# Patient Record
Sex: Male | Born: 1995 | Race: White | Hispanic: No | Marital: Single
Health system: Southern US, Community
[De-identification: ages and names within clinical notes are randomized; demographics above are authoritative.]

## PROBLEM LIST (undated history)

## (undated) DIAGNOSIS — S43014A Anterior dislocation of right humerus, initial encounter: Secondary | ICD-10-CM

## (undated) DIAGNOSIS — S43439A Superior glenoid labrum lesion of unspecified shoulder, initial encounter: Secondary | ICD-10-CM

## (undated) DIAGNOSIS — R198 Other specified symptoms and signs involving the digestive system and abdomen: Secondary | ICD-10-CM

## (undated) DIAGNOSIS — R1310 Dysphagia, unspecified: Secondary | ICD-10-CM

---
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
## 2017-08-26 IMAGING — CR DG SHOULDER 2+V*R*
3 series · 3 of 3 positions shown · non-contrast
Comparison: None.

CLINICAL DATA: Patient started football practice a few weeks ago;
c/o anterior and superior shoulder discomfort and popping; raising
arm above head is difficult; no specific injury, just contact from
football practice

EXAM:
RIGHT SHOULDER - 2+ VIEW

[w shoulder ap external righ]
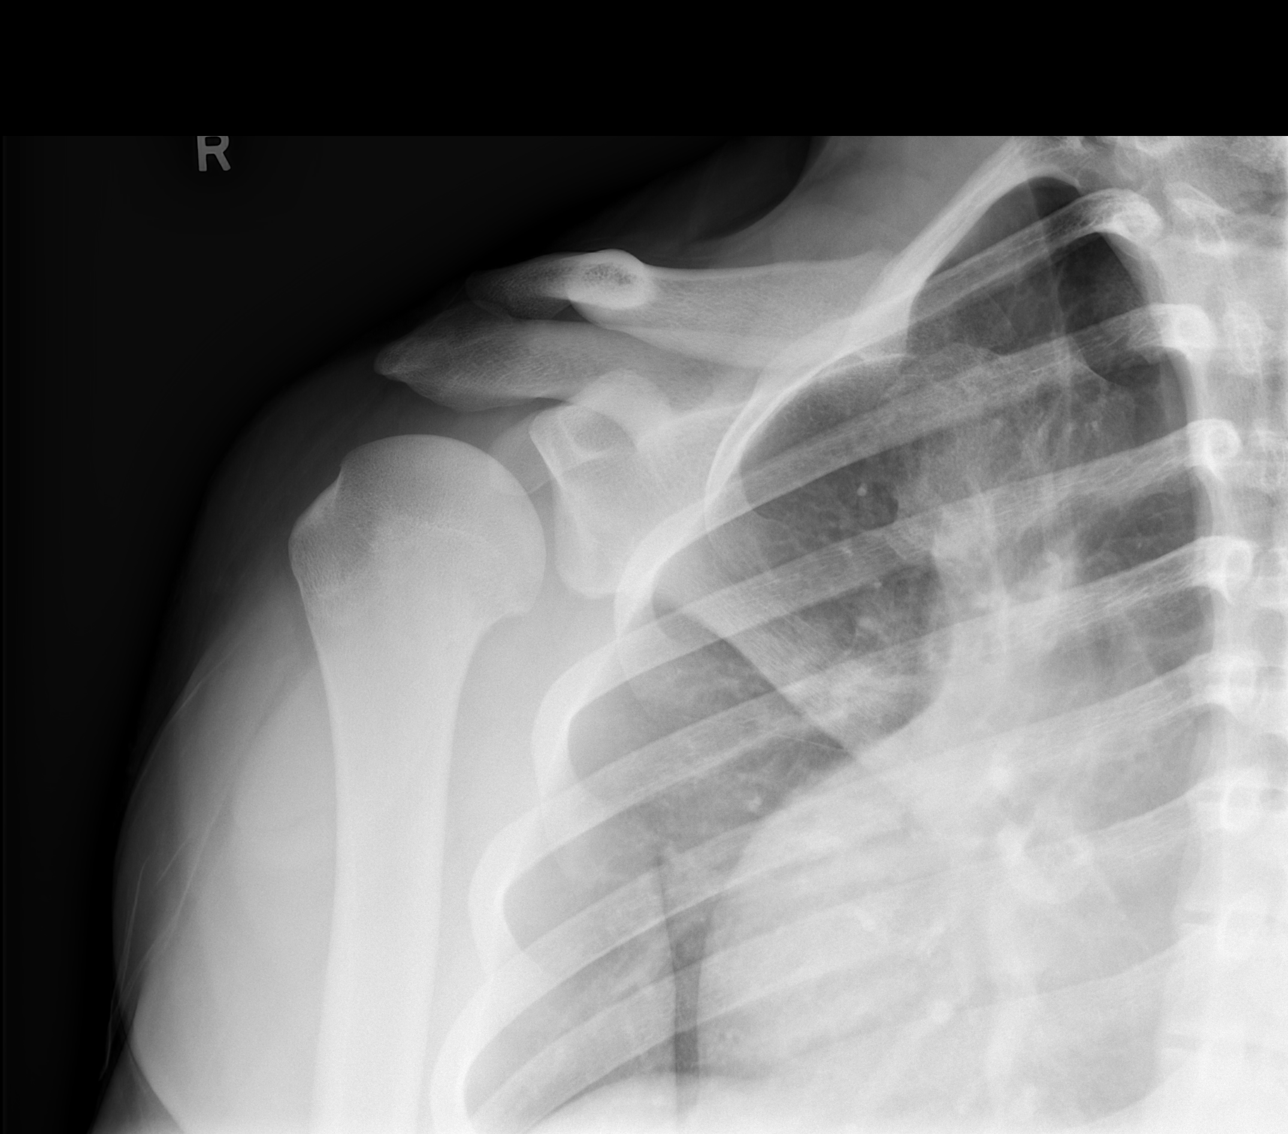

[w shoulder y view right]
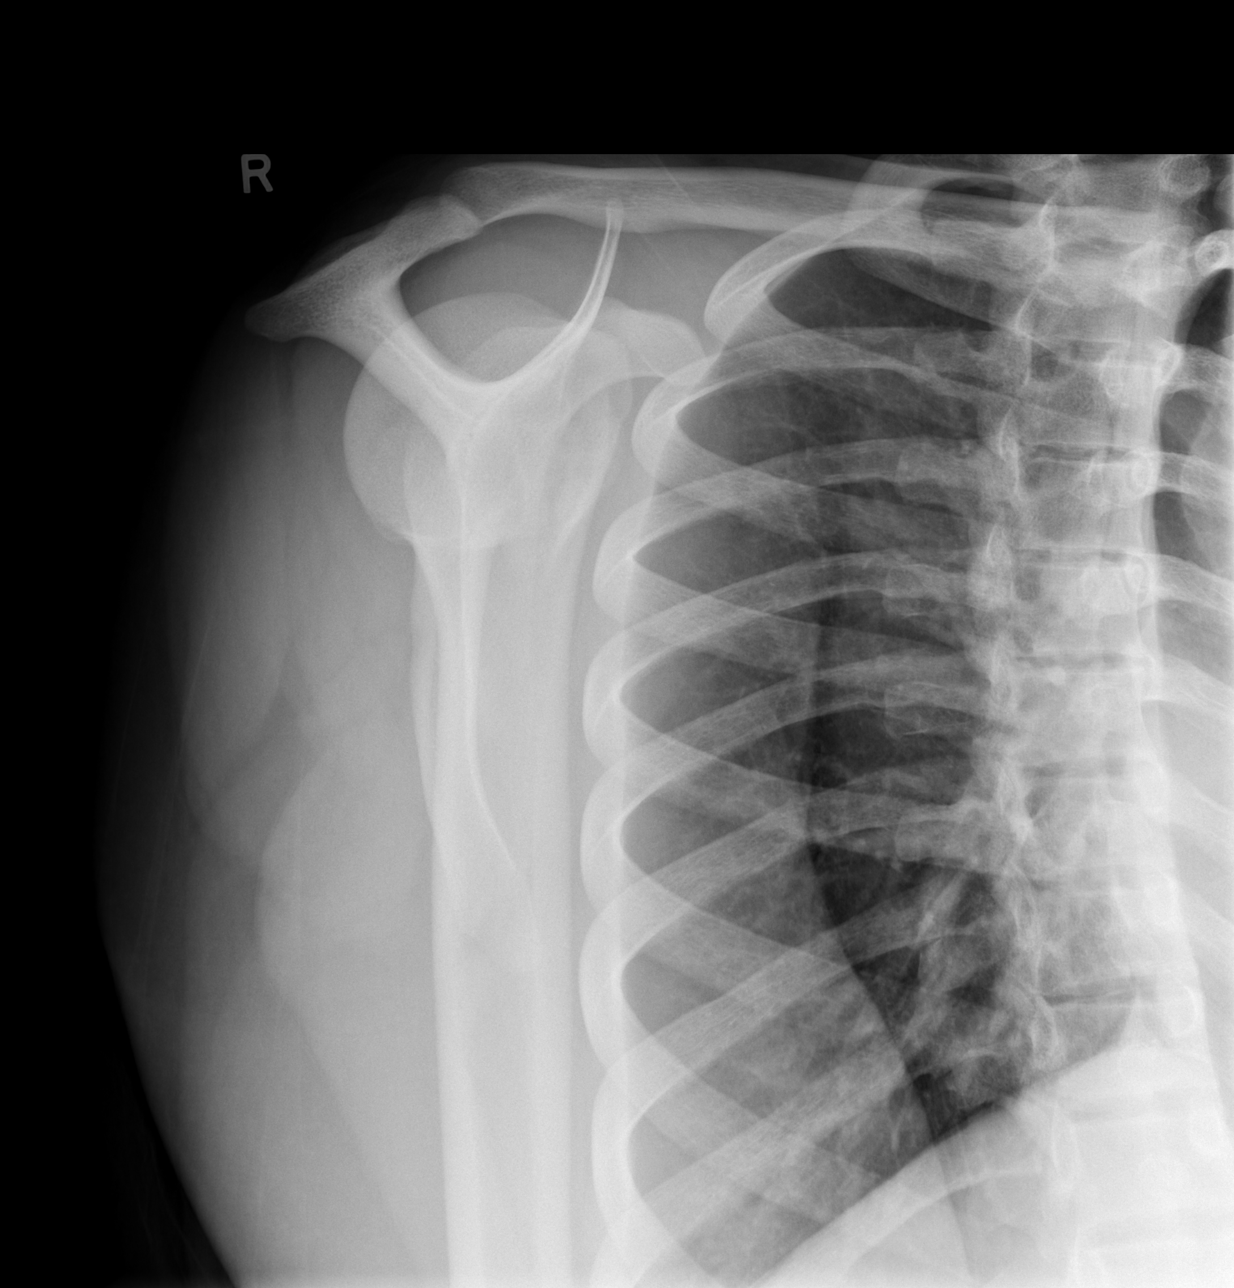

[x shoulder axillary right]
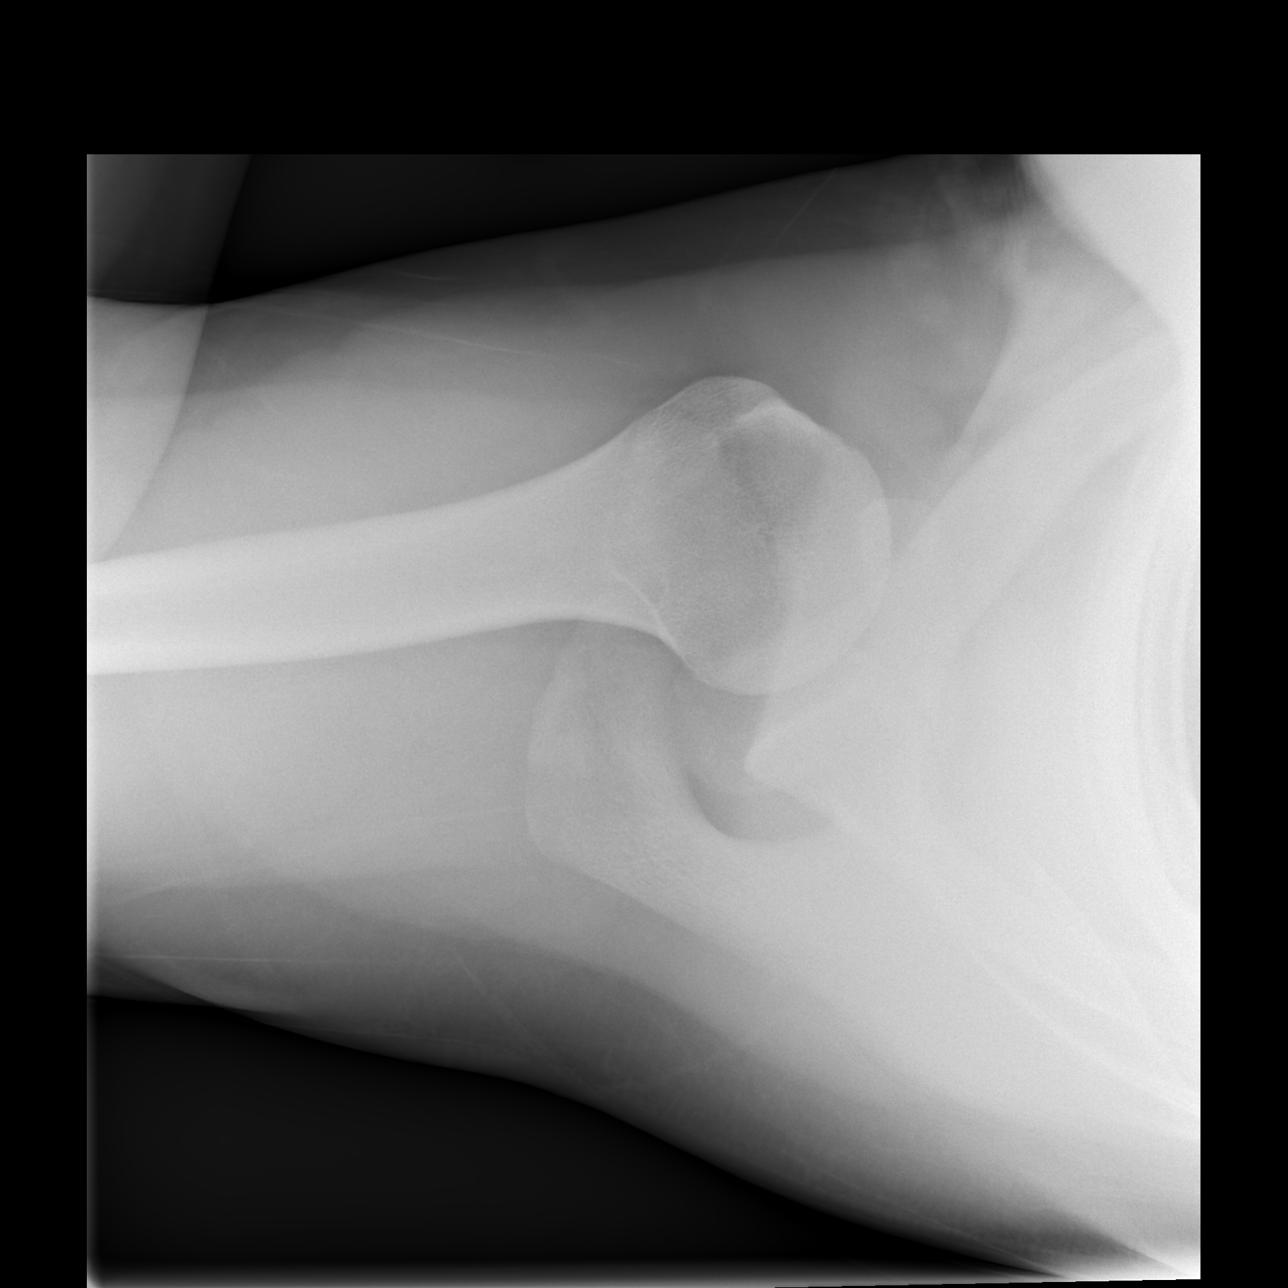

[3 of 3 positions shown; findings below may reference images not displayed]

FINDINGS: There is no evidence of fracture or dislocation. There is no
evidence of arthropathy or other focal bone abnormality. Soft
tissues are unremarkable.
IMPRESSION: Negative.

## 2017-09-11 IMAGING — MR MR SHOULDER*R* W/CM
4 of 5 series · 19 of 40 positions shown · IV contrast (agent unspecified)
Comparison: None.

CLINICAL DATA: Right shoulder pain for 3 years.

EXAM:
MR ARTHROGRAM OF THE  SHOULDER
TECHNIQUE: Multiplanar, multisequence MR imaging of the shoulder was performed
following the administration of intra-articular contrast.
CONTRAST:  See Injection Documentation.

[Series 3: T1 fat-sat · axial · 4.0mm · 0.22mm/px · z∈[-15,+64]mm · 5 of 23 slices shown (1 of 2)]
[im 1/23]
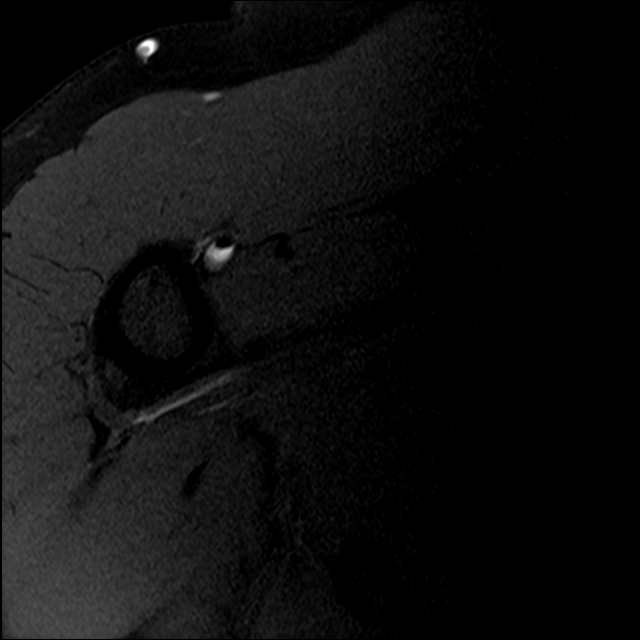
[im 4/23]
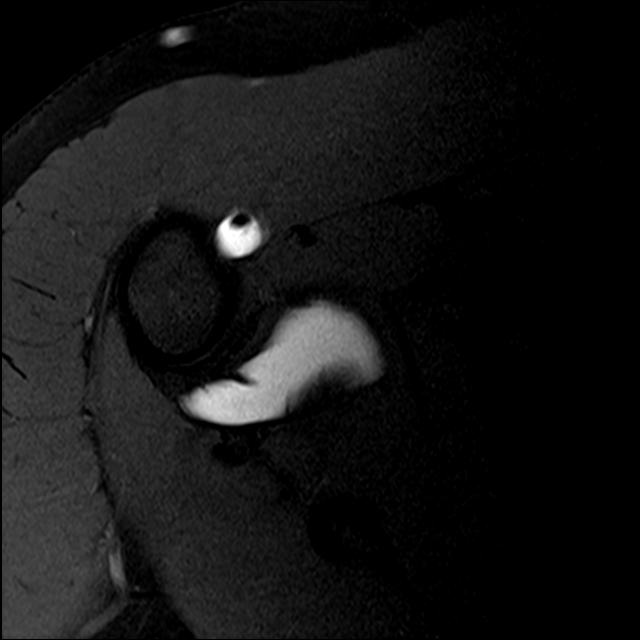
[im 7/23]
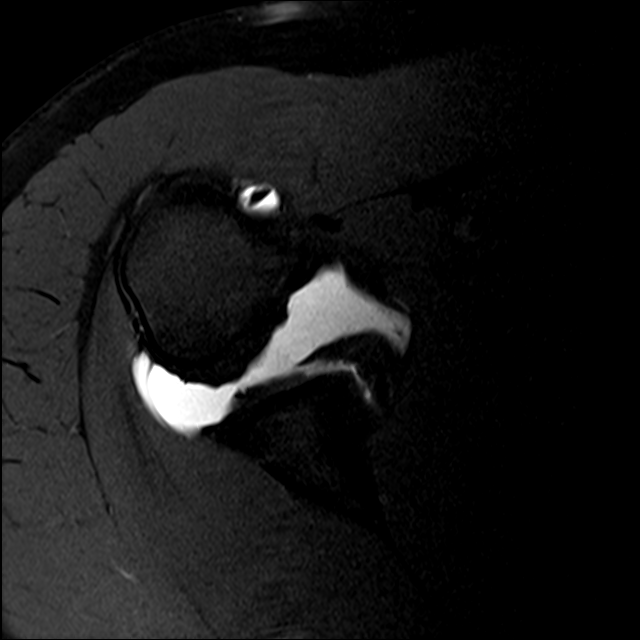
[im 13/23]
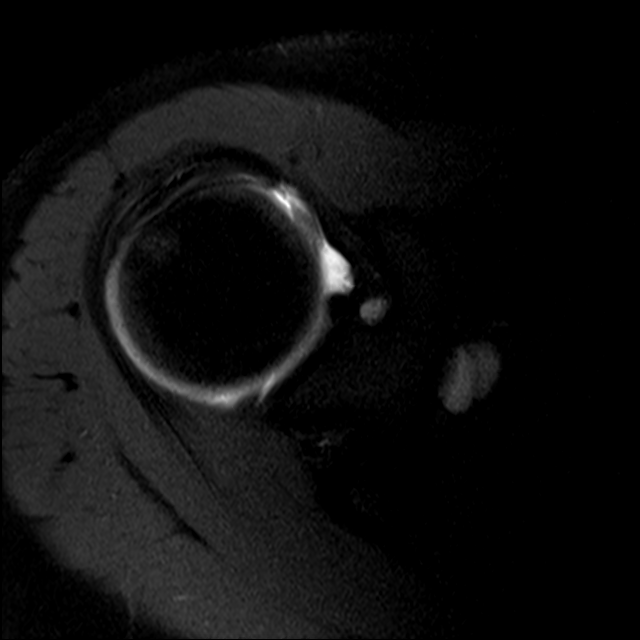
[im 19/23]
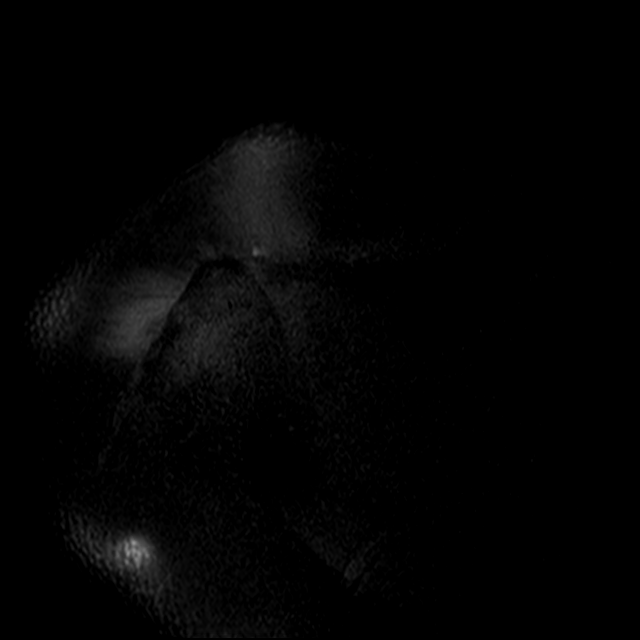

[Series 4: T2 fat-sat · coronal · 4.0mm · 0.44mm/px · 8 of 20 slices shown]
[im 1/20]
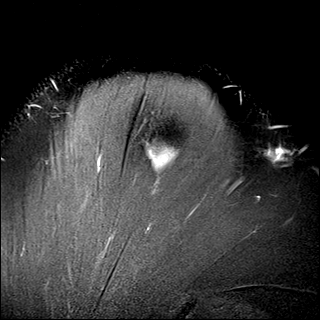
[im 3/20]
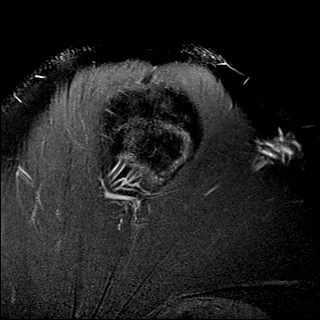
[im 6/20]
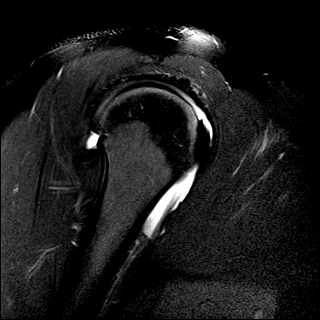
[im 9/20]
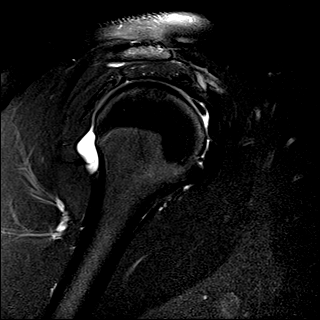
[im 11/20]
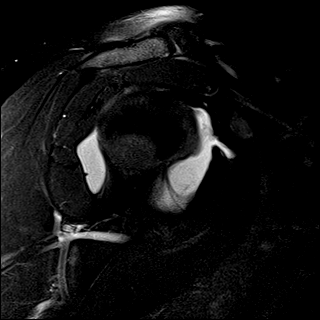
[im 14/20]
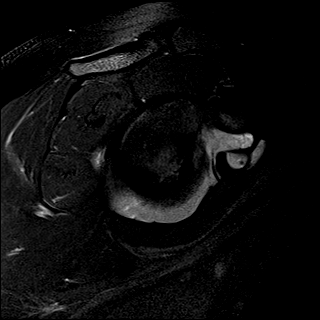
[im 17/20]
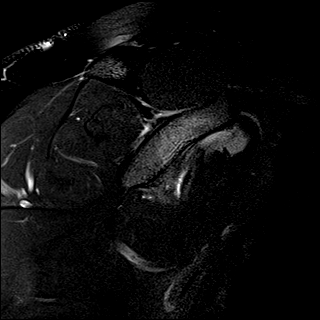
[im 20/20]
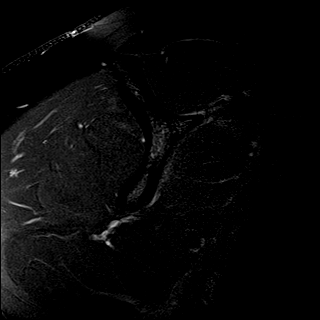

[Series 5: T1 fat-sat · oblique · 4.0mm · 0.22mm/px · 3 of 20 slices shown (2 of 2)]
[im 3/20]
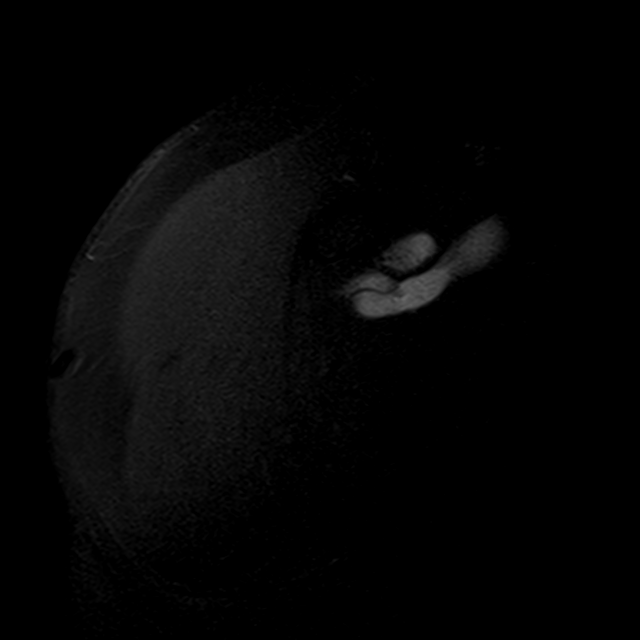
[im 11/20]
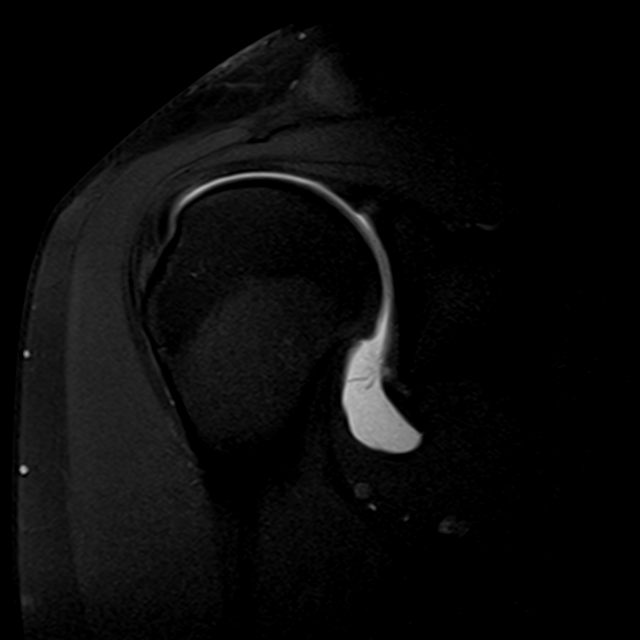
[im 17/20]
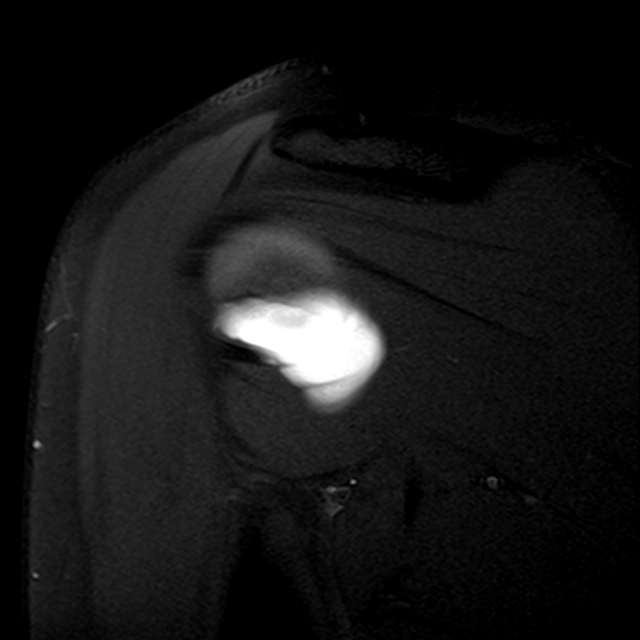

[Series 6: T1 · oblique · 4.0mm · 0.22mm/px · 3 of 20 slices shown]
[im 3/20]
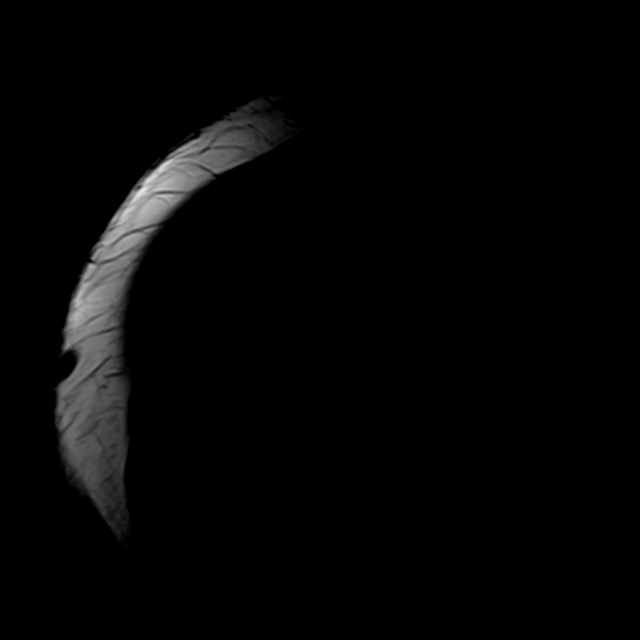
[im 11/20]
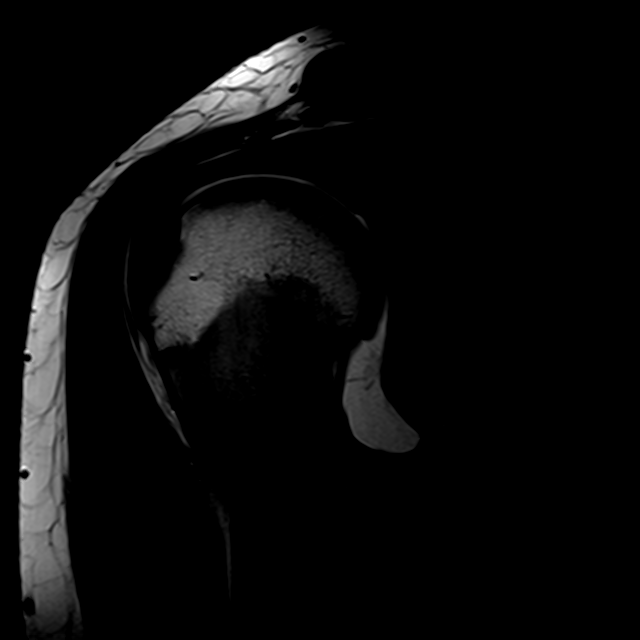
[im 17/20]
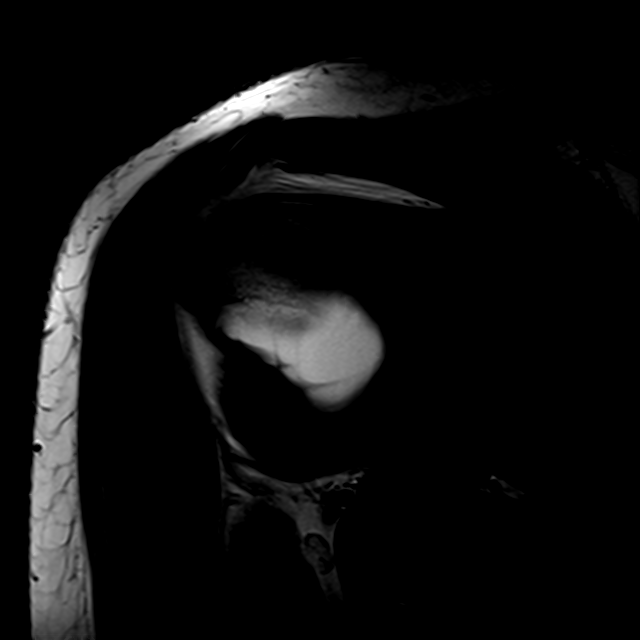

[19 of 40 positions shown; findings below may reference images not displayed]

FINDINGS: Rotator cuff: Mild tendinosis of the supraspinatus and infraspinatus
tendons. Teres minor tendon is intact. Subscapularis tendon is
intact.

Muscles: No atrophy or fatty replacement of nor abnormal signal
within, the muscles of the rotator cuff.

Biceps long head: Intact.

Acromioclavicular Joint: Normal acromioclavicular joint. Type IV
acromion. No subacromial/subdeltoid bursal fluid.

Glenohumeral Joint: Intra-articular contrast distending the joint
capsule. No chondral defect. Normal glenohumeral ligaments.

Labrum: Anterior-inferior labral tear with underlying glenoid
fracture involving 25% of the circumference.

Bones: Mild marrow edema of the superior lateral humeral head. No
other marrow signal abnormality. No acute fracture or dislocation.
IMPRESSION: 1. Anterior-inferior labral tear with underlying glenoid fracture
involving 25% of the circumference.

2. Mild tendinosis of the supraspinatus and infraspinatus tendons.

## 2017-09-11 IMAGING — XA DG FLUORO GUIDE NDL PLC/BX
1 series · 1 of 1 positions shown · non-contrast
Comparison: none

CLINICAL DATA: Football player with shoulder injury and pain.

[Series 1: ortho standard · 1 of 1 slices shown]
[im 1/1]
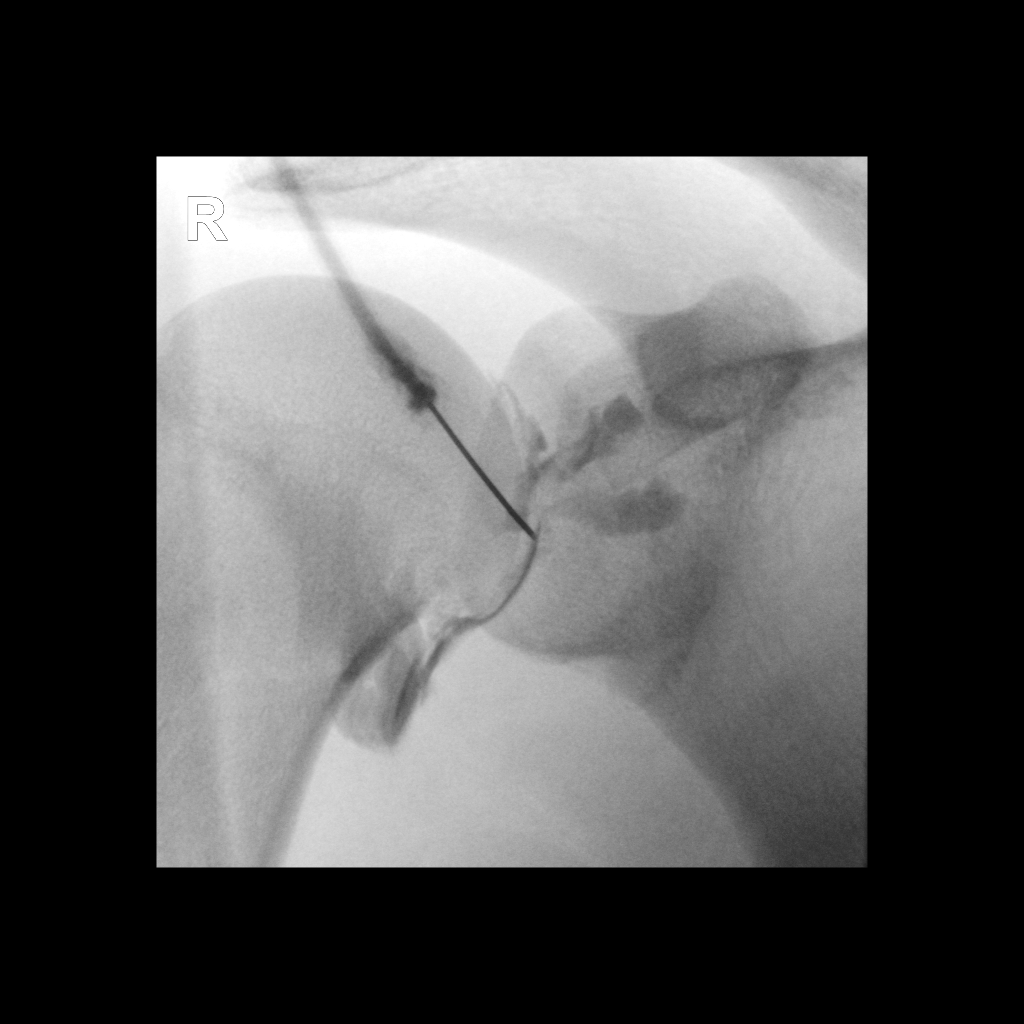

[1 of 1 positions shown; findings below may reference images not displayed]

FLUOROSCOPY TIME:  0 minutes 8 seconds. 15.00 micro gray meter
squared

PROCEDURE:
Right SHOULDER INJECTION UNDER FLUOROSCOPY

An appropriate skin entrance site was determined. The site was
marked, prepped with Betadine, draped in the usual sterile fashion,
and infiltrated locally with Lidocaine. 22 gauge spinal needle was
advanced into the glenohumeral joint on 1 pass. A mixture of 0.1 ml
Multihance and 20 ml of dilute Isovue 200 was then used to fill the
right shoulder joint.
IMPRESSION: Technically successful right shoulder injection for MRI.

## 2020-04-30 ENCOUNTER — Ambulatory Visit: Payer: Self-pay | Attending: Internal Medicine

## 2020-04-30 DIAGNOSIS — Z23 Encounter for immunization: Secondary | ICD-10-CM

## 2020-04-30 NOTE — Progress Notes (Signed)
   Covid-[facility] Vaccination [facility]  Name:  Dennis Woods    MRN: [id number] DOB: [birth year]  04/30/2020  [name] was observed post Covid-19 immunization for 15 minutes without incident. He was provided with Vaccine Information Sheet and instruction to access the V-Safe system.   [name] was instructed to call 911 with any severe reactions post vaccine: Marland Kitchen Difficulty breathing  . Swelling of face and throat  . A fast heartbeat  . A bad rash all over body  . Dizziness and weakness   Immunizations Administered    Name Date Dose VIS Date Route   Pfizer COVID-19 Vaccine 04/30/2020 10:58 AM 0.3 mL 02/08/2019 Intramuscular   Manufacturer: [employer], [employer]   Lot: [id number]   NDC: [address]-4

## 2020-05-21 ENCOUNTER — Ambulatory Visit: Payer: Self-pay | Attending: Internal Medicine

## 2020-05-21 DIAGNOSIS — Z23 Encounter for immunization: Secondary | ICD-10-CM

## 2020-05-21 NOTE — Progress Notes (Signed)
   Covid-19 Vaccination Clinic  Name:  Dennis Woods    MRN: [id number] DOB: 09-Mar-1996  05/21/2020  [name] was observed post Covid-19 immunization for 15 minutes without incident. He was provided with Vaccine Information Sheet and instruction to access the V-Safe system.   [name] was instructed to call 911 with any severe reactions post vaccine: Marland Kitchen Difficulty breathing  . Swelling of face and throat  . A fast heartbeat  . A bad rash all over body  . Dizziness and weakness   Immunizations Administered    Name Date Dose VIS Date Route   Pfizer COVID-19 Vaccine 05/21/2020 11:50 AM 0.3 mL 02/08/2019 Intramuscular   Manufacturer: [employer], [employer]   Lot: [id number]   NDC: [address]-4
